# Patient Record
Sex: Male | Born: 1990 | Race: Asian | Hispanic: No | Marital: Single | Smoking: Never smoker
Health system: Southern US, Community
[De-identification: ages and names within clinical notes are randomized; demographics above are authoritative.]

---

## 2011-01-07 ENCOUNTER — Emergency Department (INDEPENDENT_AMBULATORY_CARE_PROVIDER_SITE_OTHER): Payer: Self-pay

## 2011-01-07 ENCOUNTER — Emergency Department (HOSPITAL_BASED_OUTPATIENT_CLINIC_OR_DEPARTMENT_OTHER)
Admission: EM | Admit: 2011-01-07 | Discharge: 2011-01-07 | Disposition: A | Discharge status: Home / Self Care | Payer: Self-pay | Attending: Emergency Medicine | Admitting: Emergency Medicine

## 2011-01-07 DIAGNOSIS — R51 Headache: Secondary | ICD-10-CM | POA: Insufficient documentation

## 2012-11-15 IMAGING — CT CT HEAD W/O CM
1 series · 16 of 30 positions shown, 20 images · non-contrast
Comparison: None.

CLINICAL DATA: Headache.

CT HEAD WITHOUT CONTRAST
TECHNIQUE: Contiguous axial images were obtained from the base of
the skull through the vertex without contrast.

[Series 2: head 4.8 h37s · axial · 0.43mm/px · z∈[-143,-10]mm · 16 of 32 slices shown, 20 images]
[im 2/32  brain]
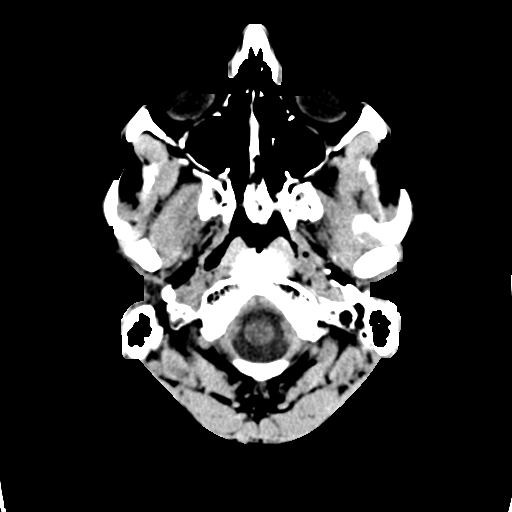
[im 2/32  bone]
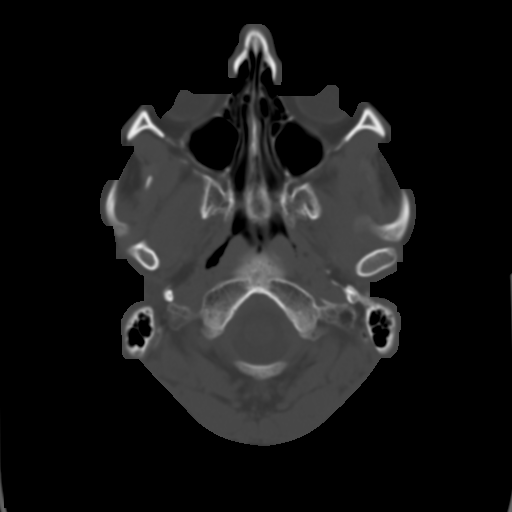
[im 4/32  brain]
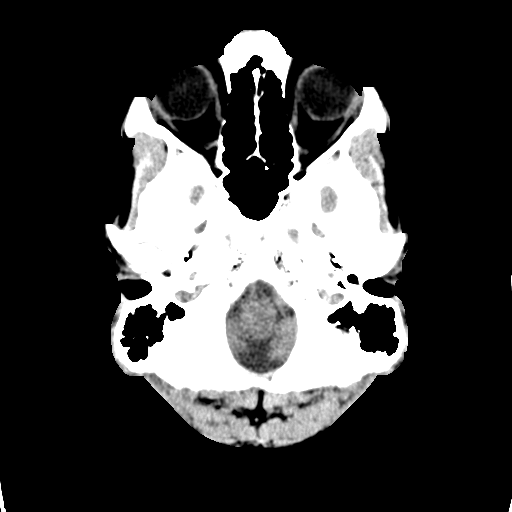
[im 6/32  brain]
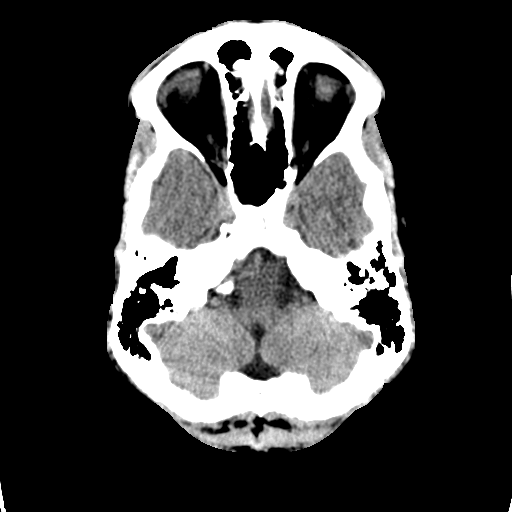
[im 8/32  brain]
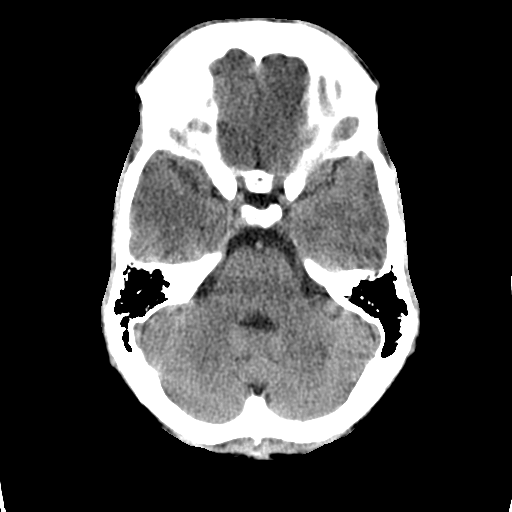
[im 9/32  brain]
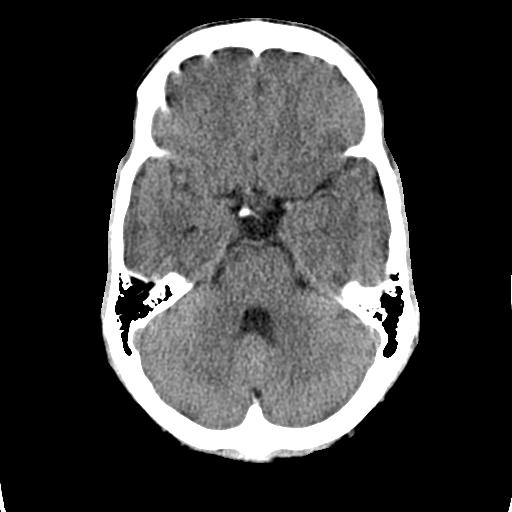
[im 9/32  bone]
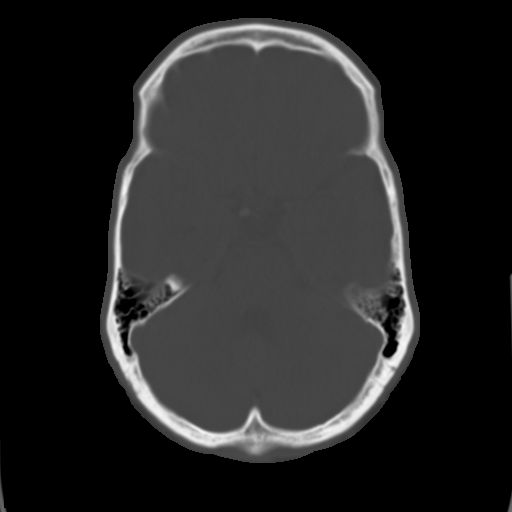
[im 11/32  brain]
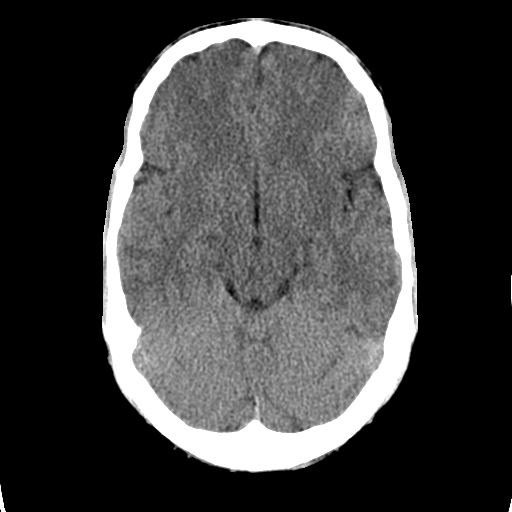
[im 13/32  brain]
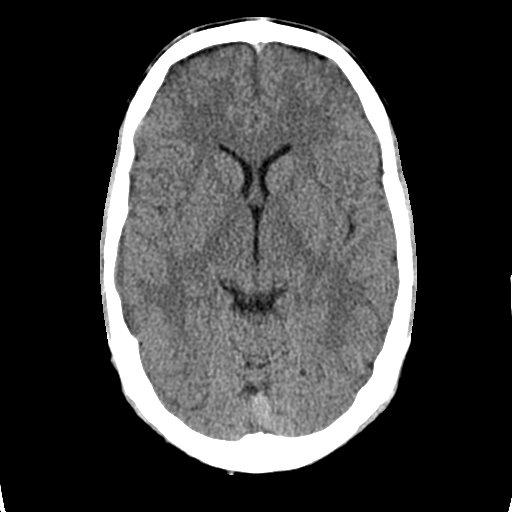
[im 15/32  brain]
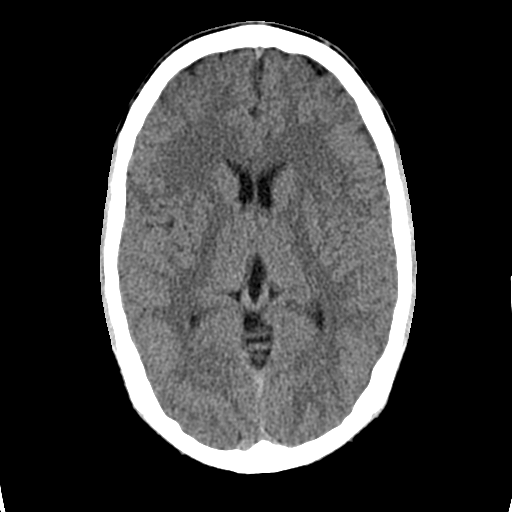
[im 17/32  brain]
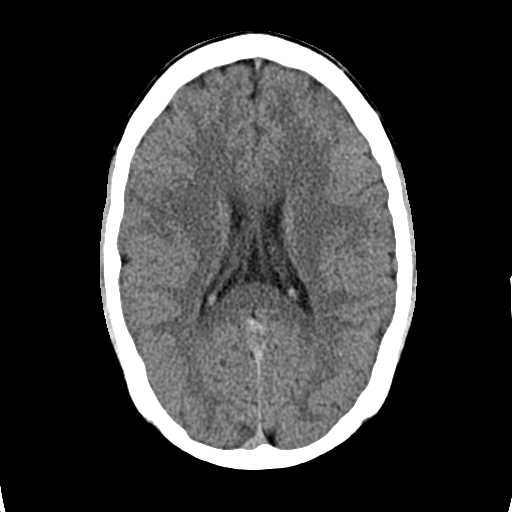
[im 17/32  bone]
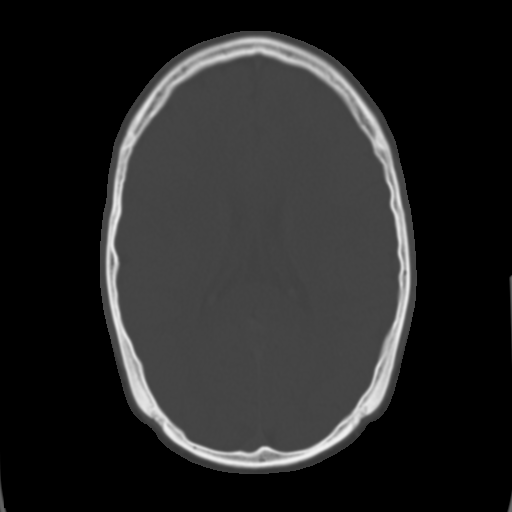
[im 19/32  brain]
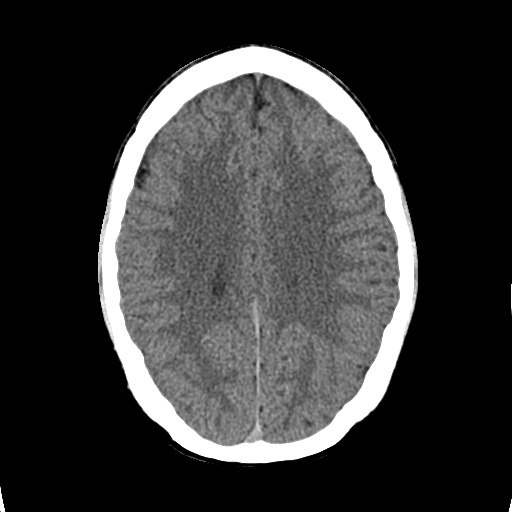
[im 21/32  brain]
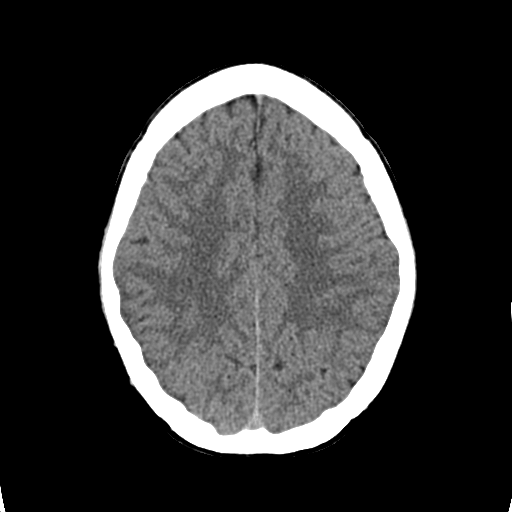
[im 23/32  brain]
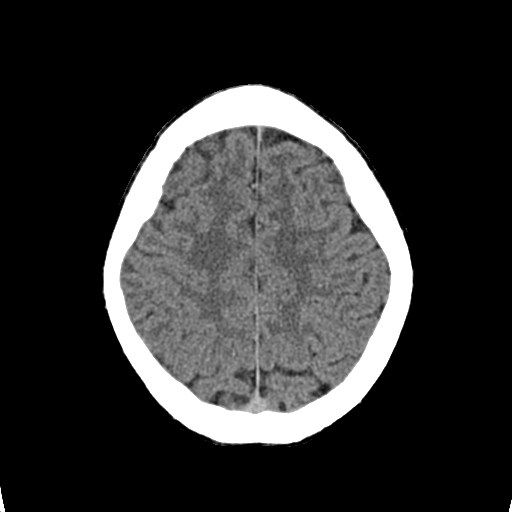
[im 24/32  brain]
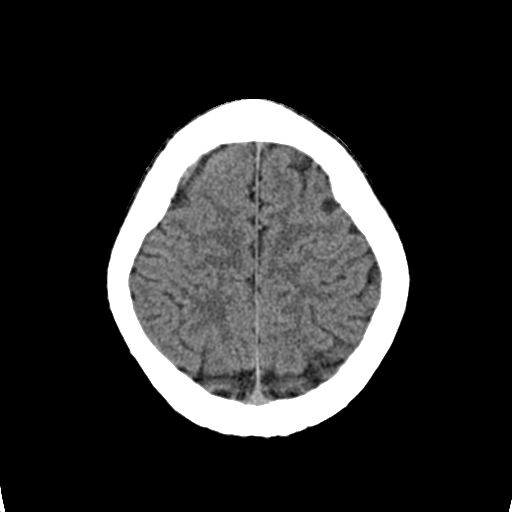
[im 24/32  bone]
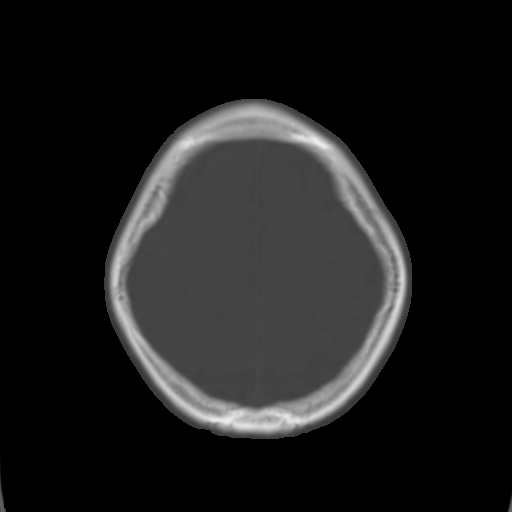
[im 26/32  brain]
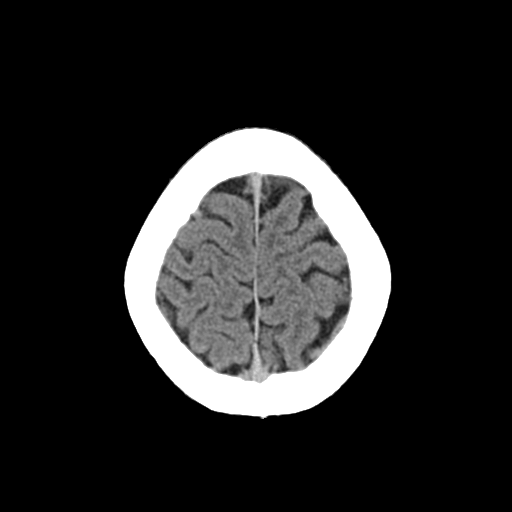
[im 28/32  brain]
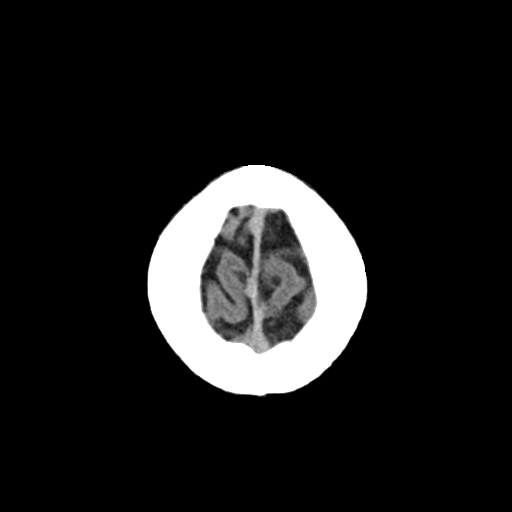
[im 30/32  brain]
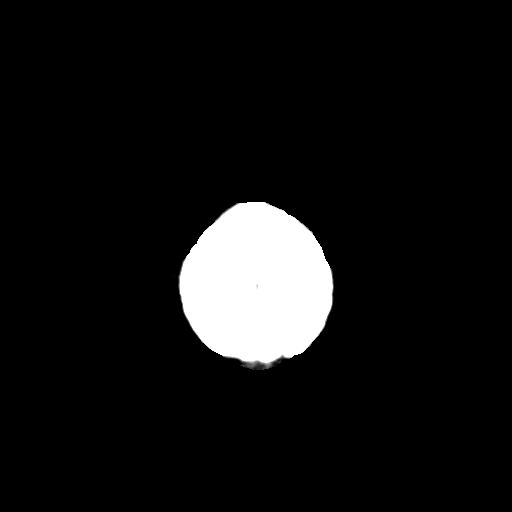

[16 of 30 positions shown; findings below may reference images not displayed]

FINDINGS: The brain appears normal without evidence of acute
infarction, hemorrhage, mass lesion, mass effect, midline shift or
abnormal extra-axial fluid collection.  Visualized paranasal
sinuses and mastoid air cells are clear.  Calvarium intact.
IMPRESSION: Normal study.

## 2015-10-22 ENCOUNTER — Emergency Department
Admission: EM | Admit: 2015-10-22 | Discharge: 2015-10-22 | Disposition: A | Discharge status: Home / Self Care | Payer: Self-pay | Source: Home / Self Care | Attending: Family Medicine | Admitting: Family Medicine

## 2015-10-22 ENCOUNTER — Encounter: Payer: Self-pay | Admitting: Emergency Medicine

## 2015-10-22 DIAGNOSIS — J069 Acute upper respiratory infection, unspecified: Secondary | ICD-10-CM

## 2015-10-22 DIAGNOSIS — R21 Rash and other nonspecific skin eruption: Secondary | ICD-10-CM

## 2015-10-22 DIAGNOSIS — B9789 Other viral agents as the cause of diseases classified elsewhere: Secondary | ICD-10-CM

## 2015-10-22 MED ORDER — BENZONATATE 200 MG PO CAPS: 200.0000 mg | ORAL_CAPSULE | Freq: Every day | ORAL | Status: AC

## 2015-10-22 MED ORDER — TRIAMCINOLONE ACETONIDE 40 MG/ML IJ SUSP
40.0000 mg | Freq: Once | INTRAMUSCULAR | Status: AC
  Administered 2015-10-22: 40 mg via INTRAMUSCULAR

## 2015-10-22 NOTE — Discharge Instructions (Signed)
Take plain guaifenesin (  extended release tabs such as Mucinex) twice daily, with plenty of water, for cough and congestion.  May add Pseudoephedrine ( , one or two every 4 to 6 hours) for sinus congestion.  Get adequate rest.   May use Afrin nasal spray (or generic oxymetazoline) twice daily for about 5 days and then discontinue.  Also recommend using saline nasal spray several times daily and saline nasal irrigation (AYR is a common brand).   Try warm salt water gargles for sore throat.  Stop all antihistamines for now, and other non-prescription cough/cold preparations. Stop azithromycin. Follow-up with family doctor if not improving about 6 days.    Drug Rash A drug rash is a change in the color or texture of the skin that is caused by a drug. It can develop minutes, hours, or days after the person takes the drug. CAUSES This condition is usually caused by a drug allergy. It can also be caused by exposure to sunlight after taking a drug that makes the skin sensitive to light. Drugs that commonly cause rashes include:  Penicillin.  Antibiotic medicines.  Medicines that treat seizures.  Medicines that treat cancer (chemotherapy).  Aspirin and other nonsteroidal anti-inflammatory drugs (NSAIDs).  Injectable dyes that contain iodine.  Insulin. SYMPTOMS Symptoms of this condition include:  Redness.  Tiny bumps.  Peeling.  Itching.  Itchy welts (hives).  Swelling. The rash may appear on a small area of skin or all over the body. DIAGNOSIS To diagnose the condition, your health care provider will do a physical exam. He or she may also order tests to find out which drug caused the rash. Tests to find the cause of a rash include:  Skin tests.  Blood tests.  Drug challenge. For this test, you stop taking all of the drugs that you do not need to take, and then you start taking them again by adding back one of the drugs at a time. TREATMENT A drug rash may be  treated with medicines, including:  Antihistamines. These may be given to relieve itching.  An NSAID. This may be given to reduce swelling and treat pain.  A steroid drug. This may be given to reduce swelling. The rash usually goes away when the person stops taking the drug that caused it. HOME CARE INSTRUCTIONS  Take medicines only as directed by your health care provider.  Let all of your health care providers know about any drug reactions you have had in the past.  If you have hives, take a cool shower or use a cool compress to relieve itchiness. SEEK MEDICAL CARE IF:  You have a fever.  Your rash is not going away.  Your rash gets worse.  Your rash comes back.  You have wheezing or coughing. SEEK IMMEDIATE MEDICAL CARE IF:  You start to have breathing problems.  You start to have shortness of breath.  You face or throat starts to swell.  You have severe weakness with dizziness or fainting.  You have chest pain.   This information is not intended to replace advice given to you by your health care provider. Make sure you discuss any questions you have with your health care provider.   Document Released: 10/11/2004 Document Revised: 09/24/2014 Document Reviewed: 06/30/2014 Elsevier Interactive Patient Education [employer]

## 2015-10-22 NOTE — ED Provider Notes (Signed)
CSN: [id number]     Arrival date & time 10/22/15  1300 History   First MD Initiated Contact with Patient 10/22/15 1525     Chief Complaint  Patient presents with  . Rash      HPI Comments: Patient complains of five day history of typical cold-like symptoms including mild sore throat, sinus congestion, headache, fatigue, and cough.  He was prescribed a Z-pack two days ago and subsequently developed a burning mild rash initially on his arms, and now more generalized.  He feels well otherwise.  Patient is a 25 y.o. male presenting with rash. The history is provided by the patient.  Rash Location:  Full body Quality: burning and redness   Quality: not blistering, not draining, not itchy, not peeling, not swelling and not weeping   Severity:  Mild Onset quality:  Sudden Duration:  2 days Timing:  Constant Progression:  Improving Chronicity:  New Context: medications   Relieved by:  None tried Worsened by:  Nothing tried Ineffective treatments:  Antihistamines Associated symptoms: URI   Associated symptoms: no abdominal pain, no diarrhea, no fatigue, no fever, no headaches, no joint pain, no myalgias, no nausea, no sore throat, no throat swelling, no tongue swelling and not wheezing     History reviewed. No pertinent past medical history. History reviewed. No pertinent past surgical history. Family History  Problem Relation Age of Onset  . Hypertension Mother    Social History  Substance Use Topics  . Smoking status: Never Smoker   . Smokeless tobacco: None  . Alcohol Use: Yes    Review of Systems  Constitutional: Negative for fever and fatigue.  HENT: Negative for sore throat.   Respiratory: Negative for wheezing.   Gastrointestinal: Negative for nausea, abdominal pain and diarrhea.  Musculoskeletal: Negative for myalgias and arthralgias.  Skin: Positive for rash.  Neurological: Negative for headaches.  All other systems reviewed and are negative.   Allergies   Azithromycin  Home Medications   Prior to Admission medications   Medication Sig Start Date End Date Taking? Authorizing Provider  benzonatate (TESSALON) 200 MG capsule Take 1 capsule (200 mg total) by mouth at bedtime. Take as needed for cough 10/22/15   Lattie Haw, MD   Meds Ordered and Administered this Visit   Medications  triamcinolone acetonide (KENALOG-40) injection 40 mg (not administered)    BP 107/73 mmHg  Pulse 80  Temp(Src) 98.6 F (37 C) (Oral)  Resp 16  Ht  (1.88 m)  Wt 169 lb (76.658 kg)  BMI 21.69 kg/m2  SpO2 100% No data found.   Physical Exam Nursing notes and Vital Signs reviewed. Appearance:  Patient appears stated age, and in no acute distress Eyes:  Pupils are equal, round, and reactive to light and accomodation.  Extraocular movement is intact.  Conjunctivae are not inflamed  Ears:  Canals normal.  Tympanic membranes normal.  Nose:  Mildly congested turbinates.  No sinus tenderness.   Pharynx:  Normal Neck:  Supple.  No adenopathy Lungs:  Clear to auscultation.  Breath sounds are equal.  Moving air well. Heart:  Regular rate and rhythm without murmurs, rubs, or gallops.  Abdomen:  Nontender without masses or hepatosplenomegaly.  Bowel sounds are present.  No CVA or flank tenderness.  Extremities:  No edema.  Skin:   Macular lightly erythematous scattered eruption on trunk and extremities.  Some lesions are suggestive of "target lesions."  ED Course  Procedures  None    MDM  1. Rash and nonspecific skin eruption; suspect reaction to azithromycin  2. Viral URI with cough    Administered Kenalog  IM Prescription written for Benzonatate (Tessalon) to take at bedtime for night-time cough.  Take plain guaifenesin (  extended release tabs such as Mucinex) twice daily, with plenty of water, for cough and congestion.  May add Pseudoephedrine ( , one or two every 4 to 6 hours) for sinus congestion.  Get adequate rest.   May use  Afrin nasal spray (or generic oxymetazoline) twice daily for about 5 days and then discontinue.  Also recommend using saline nasal spray several times daily and saline nasal irrigation (AYR is a common brand).   Try warm salt water gargles for sore throat.  Stop all antihistamines for now, and other non-prescription cough/cold preparations. Stop azithromycin. Follow-up with family doctor if not improving about 6 days.    Lattie Haw, MD 10/26/15 [phone]

## 2015-10-22 NOTE — ED Notes (Signed)
Reports onset of rash 2 days ago after taking 2 doses of Zpack for URI. Has tried benadryl but rash on hands, feet, shoulders and back and seems to be spreading. No dyspnea.

## 2015-10-28 ENCOUNTER — Telehealth: Payer: Self-pay | Admitting: *Deleted
